# Patient Record
Sex: Female | Born: 1985 | Race: Black or African American | Hispanic: No | Marital: Single | Smoking: Current every day smoker
Health system: Southern US, Community
[De-identification: ages and names within clinical notes are randomized; demographics above are authoritative.]

## PROBLEM LIST (undated history)

## (undated) DIAGNOSIS — E282 Polycystic ovarian syndrome: Secondary | ICD-10-CM

## (undated) HISTORY — PX: LEEP: SHX91

---

## 2006-07-04 ENCOUNTER — Emergency Department (HOSPITAL_COMMUNITY): Admission: EM | Admit: 2006-07-04 | Discharge: 2006-07-04 | Payer: Self-pay | Admitting: Emergency Medicine

## 2006-07-05 ENCOUNTER — Emergency Department (HOSPITAL_COMMUNITY): Admission: EM | Admit: 2006-07-05 | Discharge: 2006-07-05 | Payer: Self-pay | Admitting: Family Medicine

## 2007-06-02 ENCOUNTER — Emergency Department (HOSPITAL_COMMUNITY): Admission: EM | Admit: 2007-06-02 | Discharge: 2007-06-02 | Payer: Self-pay | Admitting: Emergency Medicine

## 2007-06-09 ENCOUNTER — Emergency Department (HOSPITAL_COMMUNITY): Admission: EM | Admit: 2007-06-09 | Discharge: 2007-06-09 | Payer: Self-pay | Admitting: Emergency Medicine

## 2008-04-12 ENCOUNTER — Emergency Department (HOSPITAL_COMMUNITY): Admission: EM | Admit: 2008-04-12 | Discharge: 2008-04-12 | Payer: Self-pay | Admitting: Emergency Medicine

## 2009-06-10 ENCOUNTER — Emergency Department (HOSPITAL_COMMUNITY): Admission: EM | Admit: 2009-06-10 | Discharge: 2009-06-10 | Payer: Self-pay | Admitting: Pediatrics

## 2009-06-19 ENCOUNTER — Emergency Department (HOSPITAL_COMMUNITY): Admission: EM | Admit: 2009-06-19 | Discharge: 2009-06-20 | Payer: Self-pay | Admitting: Emergency Medicine

## 2009-08-05 ENCOUNTER — Emergency Department (HOSPITAL_BASED_OUTPATIENT_CLINIC_OR_DEPARTMENT_OTHER): Admission: EM | Admit: 2009-08-05 | Discharge: 2009-08-05 | Payer: Self-pay | Admitting: Emergency Medicine

## 2009-08-07 ENCOUNTER — Emergency Department (HOSPITAL_BASED_OUTPATIENT_CLINIC_OR_DEPARTMENT_OTHER): Admission: EM | Admit: 2009-08-07 | Discharge: 2009-08-07 | Payer: Self-pay | Admitting: Emergency Medicine

## 2010-10-17 ENCOUNTER — Emergency Department (HOSPITAL_BASED_OUTPATIENT_CLINIC_OR_DEPARTMENT_OTHER)
Admission: EM | Admit: 2010-10-17 | Discharge: 2010-10-17 | Payer: Self-pay | Source: Home / Self Care | Admitting: Emergency Medicine

## 2011-01-03 LAB — CULTURE, ROUTINE-ABSCESS

## 2011-02-27 ENCOUNTER — Emergency Department (HOSPITAL_COMMUNITY)
Admission: EM | Admit: 2011-02-27 | Discharge: 2011-02-27 | Disposition: A | Discharge status: Home / Self Care | Payer: Self-pay | Attending: Emergency Medicine | Admitting: Emergency Medicine

## 2011-02-27 DIAGNOSIS — R220 Localized swelling, mass and lump, head: Secondary | ICD-10-CM | POA: Insufficient documentation

## 2011-02-27 DIAGNOSIS — K047 Periapical abscess without sinus: Secondary | ICD-10-CM | POA: Insufficient documentation

## 2011-02-27 DIAGNOSIS — R221 Localized swelling, mass and lump, neck: Secondary | ICD-10-CM | POA: Insufficient documentation

## 2011-02-27 DIAGNOSIS — K0381 Cracked tooth: Secondary | ICD-10-CM | POA: Insufficient documentation

## 2011-07-13 LAB — PREGNANCY, URINE: Preg Test, Ur: NEGATIVE

## 2011-07-18 ENCOUNTER — Encounter: Payer: Self-pay | Admitting: *Deleted

## 2011-07-18 ENCOUNTER — Emergency Department (HOSPITAL_BASED_OUTPATIENT_CLINIC_OR_DEPARTMENT_OTHER)
Admission: EM | Admit: 2011-07-18 | Discharge: 2011-07-19 | Disposition: A | Discharge status: Home / Self Care | Payer: Self-pay | Attending: Emergency Medicine | Admitting: Emergency Medicine

## 2011-07-18 ENCOUNTER — Emergency Department (INDEPENDENT_AMBULATORY_CARE_PROVIDER_SITE_OTHER): Payer: Self-pay

## 2011-07-18 DIAGNOSIS — F17200 Nicotine dependence, unspecified, uncomplicated: Secondary | ICD-10-CM | POA: Insufficient documentation

## 2011-07-18 DIAGNOSIS — R0602 Shortness of breath: Secondary | ICD-10-CM | POA: Insufficient documentation

## 2011-07-18 DIAGNOSIS — J40 Bronchitis, not specified as acute or chronic: Secondary | ICD-10-CM | POA: Insufficient documentation

## 2011-07-18 MED ORDER — IPRATROPIUM BROMIDE 0.02 % IN SOLN
RESPIRATORY_TRACT | Status: AC
  Administered 2011-07-18: 0.5 mg via RESPIRATORY_TRACT
  Filled 2011-07-18: qty 2.5

## 2011-07-18 MED ORDER — ALBUTEROL SULFATE (5 MG/ML) 0.5% IN NEBU
INHALATION_SOLUTION | RESPIRATORY_TRACT | Status: AC
  Administered 2011-07-18: 5 mg via RESPIRATORY_TRACT
  Filled 2011-07-18: qty 1

## 2011-07-18 NOTE — ED Provider Notes (Signed)
History     CSN: [id number] Arrival date & time: 07/18/2011 10:40 PM   First MD Initiated Contact with Patient 07/18/11 2330      Chief Complaint  Patient presents with  . Shortness of Breath    (Consider location/radiation/quality/duration/timing/severity/associated sxs/prior treatment) HPI This is a 25 year old black female who developed a cough about 4 weeks ago. Her mother gave her an albuterol inhaler which he used for 2 weeks until cough resolved. Since that time she states she's been short of breath meaning she is having difficulty taking a deep breath. She has not used the albuterol inhaler to treat this. She states that she feels there is a sharp pain and tightness in her right lower chest that makes taking a deep breath difficult. She also complains of a pressure in her left neck when she takes a big deep breath and also when she coughs or burps. The cough has been nonproductive. It is sometimes worsened at night by position and she sometimes has trouble finding a comfortable position due to the cough. She also complains of soreness in her lower anterior chest centered about the xiphoid.  History reviewed. No pertinent past medical history.  Past Surgical History  Procedure Date  . Leep     History reviewed. No pertinent family history.  History  Substance Use Topics  . Smoking status: Current Everyday Smoker -- 0.5 packs/day  . Smokeless tobacco: Not on file  . Alcohol Use: No    OB History    Grav Para Term Preterm Abortions TAB SAB Ect Mult Living                  Review of Systems  All other systems reviewed and are negative.    Allergies  Review of patient's allergies indicates no known allergies.  Home Medications   Current Outpatient Rx  Name Route Sig Dispense Refill  . ALBUTEROL SULFATE HFA 108 (90 BASE) MCG/ACT IN AERS Inhalation Inhale 2 puffs into the lungs every 6 (six) hours as needed. For shortness of breath and wheezing     . IBUPROFEN  200 MG PO TABS Oral Take 600 mg by mouth every 6 (six) hours as needed.      Marland Kitchen NAPROXEN SODIUM 220 MG PO CAPS Oral Take 660 mg by mouth once.        BP 145/89  Pulse 81  Temp 98.1 F (36.7 C)  Resp 16  Ht 5\' 5"  (1.651 m)  Wt 135 lb (61.236 kg)  BMI 22.47 kg/m2  SpO2 100%  LMP 06/24/2011  Physical Exam General: Well-developed, well-nourished female in no acute distress; appearance consistent with age of record HENT: normocephalic, atraumatic Eyes: pupils equal round and reactive to light; extraocular muscles intact Neck: supple; slight left neck muscle tenderness Heart: regular rate and rhythm; no murmurs, rubs or gallops Lungs: clear to auscultation bilaterally; shallow respiration Chest: The xiphoid and lower rib tenderness bilaterally Abdomen: soft; nontender; nondistended Extremities: No deformity; full range of motion; pulses normal Neurologic: Awake, alert and oriented;motor function intact in all extremities and symmetric; no facial droop Skin: Warm and dry Psychiatric: Normal mood and affect    ED Course  Procedures (including critical care time)    MDM   Nursing notes and vitals signs, including pulse oximetry, reviewed.  Summary of this visit's results, reviewed by myself:  Labs:  Results for orders placed during the hospital encounter of 08/05/09  CULTURE, ROUTINE-ABSCESS      Component Value Range  Specimen Description GROIN     Special Requests NONE     Gram Stain       Value: NO WBC SEEN     NO SQUAMOUS EPITHELIAL CELLS SEEN     NO ORGANISMS SEEN   Culture       Value: MULTIPLE ORGANISMS PRESENT, NONE PREDOMINANT     Note: NO GROUP A STREP (S.PYOGENES) ISOLATED NO STAPHYLOCOCCUS AUREUS ISOLATED   Report Status 08/09/2009 FINAL      Imaging Studies: Dg Chest 2 View  07/19/2011  *RADIOLOGY REPORT*  Clinical Data: Right lower chest pain.  Shortness of breath for 2 weeks.  CHEST - 2 VIEW  Comparison: 07/04/2006.  Findings: Small nodules in the  right upper chest, stable since the prior study, likely representing calcified granulomas. The heart size and pulmonary vascularity are normal. The lungs appear clear and expanded without focal air space disease or consolidation. No blunting of the costophrenic angles.  No significant change since previous study.  IMPRESSION: No evidence of active pulmonary disease.  Original Report Authenticated By: Marlon Pel, M.D.   12:33 AM Air movement but improved, patient able to take deeper breaths after albuterol and Atrovent neb treatment. Patient advised to continue to use inhaler as needed for shortness of breath as this likely represents lingering bronchitis. Patient had no change with GI cocktail.         Hanley Seamen, MD 07/19/11 [phone]

## 2011-07-18 NOTE — ED Notes (Signed)
Dr. Molpus at bedside. 

## 2011-07-18 NOTE — ED Notes (Signed)
Pt c/o SOb and lower back pain x 2 weeks

## 2011-07-18 NOTE — ED Notes (Signed)
Pt states for approx 2 weeks, she has had SOB with pain to right rib area, left posterior neck and a sensation of 'tightness and aching" to her mid abdomen "like there is a band across my stomach". Pt denies any injury. States she had a cough approx 13month ago, but that those symptoms resolved before these started a couple weeks ago. Pt denies any new exercise routine. denies N/V/D. Denies epigastric or chest pains. States deep breathing and certain positions makes pain and difficulty breathing worsen. Pt does not appear to be in any acute distress at this time.

## 2011-07-19 DIAGNOSIS — R079 Chest pain, unspecified: Secondary | ICD-10-CM

## 2011-07-19 DIAGNOSIS — R0989 Other specified symptoms and signs involving the circulatory and respiratory systems: Secondary | ICD-10-CM

## 2011-07-19 DIAGNOSIS — R0602 Shortness of breath: Secondary | ICD-10-CM

## 2011-07-19 MED ORDER — GI COCKTAIL ~~LOC~~
30.0000 mL | Freq: Once | ORAL | Status: AC
  Administered 2011-07-19: 30 mL via ORAL
  Filled 2011-07-19: qty 30

## 2011-07-19 MED ORDER — AZITHROMYCIN 250 MG PO TABS: 500.0000 mg | ORAL_TABLET | Freq: Every day | ORAL | Status: AC

## 2011-07-19 MED ORDER — IBUPROFEN 800 MG PO TABS
800.0000 mg | ORAL_TABLET | Freq: Once | ORAL | Status: AC
  Administered 2011-07-19: 800 mg via ORAL
  Filled 2011-07-19: qty 1

## 2012-02-18 ENCOUNTER — Emergency Department (HOSPITAL_COMMUNITY)
Admission: EM | Admit: 2012-02-18 | Discharge: 2012-02-18 | Disposition: A | Discharge status: Home / Self Care | Payer: Self-pay | Attending: Emergency Medicine | Admitting: Emergency Medicine

## 2012-02-18 ENCOUNTER — Encounter (HOSPITAL_COMMUNITY): Payer: Self-pay | Admitting: *Deleted

## 2012-02-18 ENCOUNTER — Emergency Department (HOSPITAL_COMMUNITY): Payer: Self-pay

## 2012-02-18 DIAGNOSIS — R1031 Right lower quadrant pain: Secondary | ICD-10-CM | POA: Insufficient documentation

## 2012-02-18 DIAGNOSIS — A499 Bacterial infection, unspecified: Secondary | ICD-10-CM | POA: Insufficient documentation

## 2012-02-18 DIAGNOSIS — B9689 Other specified bacterial agents as the cause of diseases classified elsewhere: Secondary | ICD-10-CM | POA: Insufficient documentation

## 2012-02-18 DIAGNOSIS — N760 Acute vaginitis: Secondary | ICD-10-CM | POA: Insufficient documentation

## 2012-02-18 DIAGNOSIS — N390 Urinary tract infection, site not specified: Secondary | ICD-10-CM | POA: Insufficient documentation

## 2012-02-18 LAB — CBC
HCT: 38.3 % (ref 36.0–46.0)
Hemoglobin: 12.8 g/dL (ref 12.0–15.0)
MCH: 26.8 pg (ref 26.0–34.0)
MCHC: 33.4 g/dL (ref 30.0–36.0)
MCV: 80.3 fL (ref 78.0–100.0)

## 2012-02-18 LAB — URINALYSIS, ROUTINE W REFLEX MICROSCOPIC
Bilirubin Urine: NEGATIVE
Glucose, UA: NEGATIVE mg/dL
Ketones, ur: NEGATIVE mg/dL
Protein, ur: 30 mg/dL — AB
pH: 6 (ref 5.0–8.0)

## 2012-02-18 LAB — RPR: RPR Ser Ql: NONREACTIVE

## 2012-02-18 LAB — WET PREP, GENITAL
Trich, Wet Prep: NONE SEEN
Yeast Wet Prep HPF POC: NONE SEEN

## 2012-02-18 LAB — POCT I-STAT, CHEM 8
BUN: 8 mg/dL (ref 6–23)
Calcium, Ion: 1.21 mmol/L (ref 1.12–1.32)
Creatinine, Ser: 0.8 mg/dL (ref 0.50–1.10)
Glucose, Bld: 119 mg/dL — ABNORMAL HIGH (ref 70–99)
TCO2: 24 mmol/L (ref 0–100)

## 2012-02-18 LAB — URINE MICROSCOPIC-ADD ON

## 2012-02-18 MED ORDER — KETOROLAC TROMETHAMINE 60 MG/2ML IM SOLN
60.0000 mg | Freq: Once | INTRAMUSCULAR | Status: AC
  Administered 2012-02-18: 60 mg via INTRAMUSCULAR
  Filled 2012-02-18: qty 2

## 2012-02-18 MED ORDER — CEPHALEXIN 500 MG PO CAPS: 500.0000 mg | ORAL_CAPSULE | Freq: Four times a day (QID) | ORAL | Status: AC

## 2012-02-18 MED ORDER — IOHEXOL 300 MG/ML  SOLN
80.0000 mL | Freq: Once | INTRAMUSCULAR | Status: AC | PRN
  Administered 2012-02-18: 80 mL via INTRAVENOUS

## 2012-02-18 MED ORDER — METRONIDAZOLE 500 MG PO TABS: 500.0000 mg | ORAL_TABLET | Freq: Two times a day (BID) | ORAL | Status: AC

## 2012-02-18 MED ORDER — DEXTROSE 5 % IV SOLN
1.0000 g | Freq: Once | INTRAVENOUS | Status: AC
  Administered 2012-02-18: 1 g via INTRAVENOUS
  Filled 2012-02-18: qty 10

## 2012-02-18 MED ORDER — IOHEXOL 300 MG/ML  SOLN
20.0000 mL | INTRAMUSCULAR | Status: AC
  Administered 2012-02-18 (×2): 20 mL via ORAL

## 2012-02-18 NOTE — Discharge Instructions (Signed)
Bacterial Vaginosis Bacterial vaginosis (BV) is a vaginal infection where the normal balance of bacteria in the vagina is disrupted. The normal balance is then replaced by an overgrowth of certain bacteria. There are several different kinds of bacteria that can cause BV. BV is the most common vaginal infection in women of childbearing age. CAUSES   The cause of BV is not fully understood. BV develops when there is an increase or imbalance of harmful bacteria.   Some activities or behaviors can upset the normal balance of bacteria in the vagina and put women at increased risk including:   Having a new sex partner or multiple sex partners.   Douching.   Using an intrauterine device (IUD) for contraception.   It is not clear what role sexual activity plays in the development of BV. However, women that have never had sexual intercourse are rarely infected with BV.  Women do not get BV from toilet seats, bedding, swimming pools or from touching objects around them.  SYMPTOMS   Grey vaginal discharge.   A fish-like odor with discharge, especially after sexual intercourse.   Itching or burning of the vagina and vulva.   Burning or pain with urination.   Some women have no signs or symptoms at all.  DIAGNOSIS  Your caregiver must examine the vagina for signs of BV. Your caregiver will perform lab tests and look at the sample of vaginal fluid through a microscope. They will look for bacteria and abnormal cells (clue cells), a pH test higher than 4.5, and a positive amine test all associated with BV.  RISKS AND COMPLICATIONS   Pelvic inflammatory disease (PID).   Infections following gynecology surgery.   Developing HIV.   Developing herpes virus.  TREATMENT  Sometimes BV will clear up without treatment. However, all women with symptoms of BV should be treated to avoid complications, especially if gynecology surgery is planned. Female partners generally do not need to be treated. However,  BV may spread between female sex partners so treatment is helpful in preventing a recurrence of BV.   BV may be treated with antibiotics. The antibiotics come in either pill or vaginal cream forms. Either can be used with nonpregnant or pregnant women, but the recommended dosages differ. These antibiotics are not harmful to the baby.   BV can recur after treatment. If this happens, a second round of antibiotics will often be prescribed.   Treatment is important for pregnant women. If not treated, BV can cause a premature delivery, especially for a pregnant woman who had a premature birth in the past. All pregnant women who have symptoms of BV should be checked and treated.   For chronic reoccurrence of BV, treatment with a type of prescribed gel vaginally twice a week is helpful.  HOME CARE INSTRUCTIONS   Finish all medication as directed by your caregiver.   Do not have sex until treatment is completed.   Tell your sexual partner that you have a vaginal infection. They should see their caregiver and be treated if they have problems, such as a mild rash or itching.   Practice safe sex. Use condoms. Only have 1 sex partner.  PREVENTION  Basic prevention steps can help reduce the risk of upsetting the natural balance of bacteria in the vagina and developing BV:  Do not have sexual intercourse (be abstinent).   Do not douche.   Use all of the medicine prescribed for treatment of BV, even if the signs and symptoms go away.     Tell your sex partner if you have BV. That way, they can be treated, if needed, to prevent reoccurrence.  SEEK MEDICAL CARE IF:   Your symptoms are not improving after 3 days of treatment.   You have increased discharge, pain, or fever.  MAKE SURE YOU:   Understand these instructions.   Will watch your condition.   Will get help right away if you are not doing well or get worse.  FOR MORE INFORMATION  Division of STD Prevention (DSTDP), Centers for Disease  Control and Prevention: SolutionApps.co.za American Social Health Association (ASHA): www.ashastd.org  Document Released: 09/17/2005 Document Revised: 09/06/2011 Document Reviewed: 03/10/2009 [facility] Patient Information 2012 [place], [place].Urinary Tract Infection Infections of the urinary tract can start in several places. A bladder infection (cystitis), a kidney infection (pyelonephritis), and a prostate infection (prostatitis) are different types of urinary tract infections (UTIs). They usually get better if treated with medicines (antibiotics) that kill germs. Take all the medicine until it is gone. You or your child may feel better in a few days, but TAKE ALL MEDICINE or the infection may not respond and may become more difficult to treat. HOME CARE INSTRUCTIONS   Drink enough water and fluids to keep the urine clear or pale yellow. Cranberry juice is especially recommended, in addition to large amounts of water.   Avoid caffeine, tea, and carbonated beverages. They tend to irritate the bladder.   Alcohol may irritate the prostate.   Only take over-the-counter or prescription medicines for pain, discomfort, or fever as directed by your caregiver.  To prevent further infections:  Empty the bladder often. Avoid holding urine for long periods of time.   After a bowel movement, women should cleanse from front to back. Use each tissue only once.   Empty the bladder before and after sexual intercourse.  FINDING OUT THE RESULTS OF YOUR TEST Not all test results are available during your visit. If your or your child's test results are not back during the visit, make an appointment with your caregiver to find out the results. Do not assume everything is normal if you have not heard from your caregiver or the medical facility. It is important for you to follow up on all test results. SEEK MEDICAL CARE IF:   There is back pain.   Your baby is older than 3 months with a rectal temperature of 100.5  F (38.1 C) or higher for more than 1 day.   Your or your child's problems (symptoms) are no better in 3 days. Return sooner if you or your child is getting worse.  SEEK IMMEDIATE MEDICAL CARE IF:   There is severe back pain or lower abdominal pain.   You or your child develops chills.   You have a fever.   Your baby is older than 3 months with a rectal temperature of 102 F (38.9 C) or higher.   Your baby is 34 months old or younger with a rectal temperature of 100.4 F (38 C) or higher.   There is nausea or vomiting.   There is continued burning or discomfort with urination.  MAKE SURE YOU:   Understand these instructions.   Will watch your condition.   Will get help right away if you are not doing well or get worse.  Document Released: 06/27/2005 Document Revised: 09/06/2011 Document Reviewed: 01/30/2007 [facility] Patient Information 2012 [place], [place].  Make sure that you take the entire course of both antibiotics as prescribed.  Increase your fluid intake.  You  may use ibuprofen if needed for pain.  Get rechecked if he develops any worse symptoms, fevers, chills or nausea or vomiting.  You should have a repeat urinalysis after your antibiotic has been completed to make sure your infection is gone.  Please refer to gynecologist listed above to establish care with.  Also given resource listed below for locating a primary medical doctor.  Also as discussed there is an incidental finding on your CT scan which may be a small hemangioma on your liver, but it is recommended  that you have a repeat liver study in 6 months, preferably MRI to further evaluate this and make sure it is unchanged.  Please discuss this with your new primary doctor who can arrange this test for you.   RESOURCE GUIDE  Dental Problems  Patients with Medicaid: [facility] [phone]                                           [phone] Phone:  [phone]                                                  Phone:  [phone]  If unable to pay or uninsured, contact:  [facility] or [facility]. to become qualified for the adult dental clinic.  Chronic Pain Problems Contact [facility]  [phone] Patients need to be referred by their primary care doctor.  Insufficient Money for Medicine Contact United Way:  call "211" or [facility] 773-500-5794.  No Primary Care Doctor Call [facility]  210-589-7955 Other agencies that provide inexpensive medical care    [facility]  [phone]    [facility]  [phone]    [facility]  [phone]    [facility]  [phone]    [facility]  [phone]    [facility]  4316711286  Psychological Services [facility]  [phone] [facility] Services  [phone] [facility]   [phone] (emergency services [phone])  Substance Abuse Resources [facility]  ([phone] [facility] [phone] [facility] [phone] Floydene Flock [phone] Residential & Outpatient Substance Abuse Program  [phone]  Abuse/Neglect [facility] Child Abuse Hotline (919)133-2957 [facility] Child Abuse Hotline 607-347-2309 (After Hours)  Emergency Shelter [facility] 901-120-7677  M[facility] [place] [facility] (682)645-3645 [facility] ([phone]  MRSA Hotline #:   239-623-7626    [facility] Resources  [facility]     [employer]                          [place] [address] [place]                       [address]      [address]  [address]  Cristobal Goldmann Phone:  [phone]                                   Phone:  [phone]                  Phone:  [phone]  [facility] Phone:  [phone]  [facility] Child Abuse Hotline [phone] [phone] (After Hours)

## 2012-02-18 NOTE — ED Notes (Signed)
Oral contrast given to patient by CT.

## 2012-02-18 NOTE — ED Notes (Signed)
To ED for eval of right flank pain and rlq pain since last night. States bending and moving makes pain worse. If she lays still pain is gone. Doesn't hurt to urinate. Denies fevers. No vomiting.

## 2012-02-18 NOTE — ED Provider Notes (Signed)
Medical screening examination/treatment/procedure(s) were performed by non-physician practitioner and as supervising physician I was immediately available for consultation/collaboration.   Mannie Wineland, MD 02/18/12 1616 

## 2012-02-18 NOTE — ED Notes (Signed)
Patient is eating a [place] sandwich with some sprite.

## 2012-02-18 NOTE — ED Notes (Signed)
Pt reports right lower quadrant pain since yesterday. Denies nausea/vomiting/fever/chills. Denies vaginal discharge. States has similar pain at times during/after intercourse. Last intercourse yesterday am, but this time pain has remained. Hx of ovarian cyst with LEEP performed, states "I don't think I healed right after surgery". Surgery performed when pt was teenager.

## 2012-02-18 NOTE — ED Provider Notes (Signed)
History     CSN: [id number]  Arrival date & time 02/18/12  4098   First MD Initiated Contact with Patient 02/18/12 1100      Chief Complaint  Patient presents with  . Back Pain  . Abdominal Pain    (Consider location/radiation/quality/duration/timing/severity/associated sxs/prior treatment) HPI Comments: April Adams presents for evaluation of right lower quadrant pain which radiates into her right lower flank since yesterday evening.  She states she was simply standing doing dishes yesterday when she had sudden onset this pain.  It has been constant and sharp in character.  It is worse with movement, and she does get some improvement when she lies  in the right decubital position.  She denies nausea vomiting fevers or chills, has had no vaginal discharge and no urinary symptoms.  Her last bowel movement was yesterday and normal.  She reports occasional similar fleeting symptoms with intercourse.  Her last intercourse was yesterday morning she was pain free at that time.  Past history is significant for a history of ovarian cyst over 4 years ago.  She has also had a LEEP procedure for atypical cervical cells.  She has no personal or family history of kidney stones.  Patient is a 26 y.o. female presenting with back pain and abdominal pain. The history is provided by the patient.  Back Pain  Associated symptoms include abdominal pain. Pertinent negatives include no chest pain, no fever, no numbness, no headaches, no dysuria and no weakness.  Abdominal Pain The primary symptoms of the illness include abdominal pain. The primary symptoms of the illness do not include fever, shortness of breath, nausea, vomiting, diarrhea, dysuria or vaginal discharge.  Additional symptoms associated with the illness include back pain. Symptoms associated with the illness do not include constipation.    History reviewed. No pertinent past medical history.  Past Surgical History  Procedure Date  . Leep       History reviewed. No pertinent family history.  History  Substance Use Topics  . Smoking status: Current Everyday Smoker -- 0.5 packs/day  . Smokeless tobacco: Not on file  . Alcohol Use: No    OB History    Grav Para Term Preterm Abortions TAB SAB Ect Mult Living                  Review of Systems  Constitutional: Negative for fever.  HENT: Negative for congestion, sore throat and neck pain.   Eyes: Negative.   Respiratory: Negative for chest tightness and shortness of breath.   Cardiovascular: Negative for chest pain.  Gastrointestinal: Positive for abdominal pain. Negative for nausea, vomiting, diarrhea and constipation.  Genitourinary: Positive for flank pain. Negative for dysuria and vaginal discharge.  Musculoskeletal: Positive for back pain. Negative for joint swelling and arthralgias.  Skin: Negative.  Negative for rash and wound.  Neurological: Negative for dizziness, weakness, light-headedness, numbness and headaches.  Hematological: Negative.   Psychiatric/Behavioral: Negative.     Allergies  Review of patient's allergies indicates no known allergies.  Home Medications   Current Outpatient Rx  Name Route Sig Dispense Refill  . ACETAMINOPHEN 500 MG PO TABS Oral Take 1,500 mg by mouth every 6 (six) hours as needed. For pain    . ASPIRIN-ACETAMINOPHEN-CAFFEINE 250-250-65 MG PO TABS Oral Take 1 tablet by mouth every 6 (six) hours as needed. For headache    . CEPHALEXIN 500 MG PO CAPS Oral Take 1 capsule (500 mg total) by mouth 4 (four) times daily. 40 capsule  0  . METRONIDAZOLE 500 MG PO TABS Oral Take 1 tablet (500 mg total) by mouth 2 (two) times daily. 14 tablet 0    BP 96/61  Pulse 63  Temp(Src) 98.6 F (37 C) (Oral)  Resp 16  SpO2 99%  LMP 02/06/2012  Physical Exam  Nursing note and vitals reviewed. Constitutional: She appears well-developed and well-nourished.  HENT:  Head: Normocephalic and atraumatic.  Eyes: Conjunctivae are normal.  Neck:  Normal range of motion.  Cardiovascular: Normal rate, regular rhythm, normal heart sounds and intact distal pulses.   Pulmonary/Chest: Effort normal and breath sounds normal. She has no wheezes.  Abdominal: Soft. Bowel sounds are normal. There is no tenderness.  Genitourinary: Vagina normal and uterus normal. Uterus is not enlarged and not tender. Cervix exhibits no motion tenderness, no discharge and no friability. Right adnexum displays no mass, no tenderness and no fullness. Left adnexum displays no mass, no tenderness and no fullness. No tenderness around the vagina.  Musculoskeletal: Normal range of motion.  Neurological: She is alert.  Skin: Skin is warm and dry.  Psychiatric: She has a normal mood and affect.    ED Course  Procedures (including critical care time)  Labs Reviewed  URINALYSIS, ROUTINE W REFLEX MICROSCOPIC - Abnormal; Notable for the following:    APPearance TURBID (*)    Hgb urine dipstick LARGE (*)    Protein, ur 30 (*)    Nitrite POSITIVE (*)    Leukocytes, UA LARGE (*)    All other components within normal limits  CBC - Abnormal; Notable for the following:    WBC 17.4 (*)    All other components within normal limits  POCT I-STAT, CHEM 8 - Abnormal; Notable for the following:    Glucose, Bld 119 (*)    All other components within normal limits  URINE MICROSCOPIC-ADD ON - Abnormal; Notable for the following:    Squamous Epithelial / LPF FEW (*)    Bacteria, UA FEW (*)    All other components within normal limits  WET PREP, GENITAL - Abnormal; Notable for the following:    Clue Cells Wet Prep HPF POC MODERATE (*)    WBC, Wet Prep HPF POC MODERATE (*)    All other components within normal limits  POCT PREGNANCY, URINE  GC/CHLAMYDIA PROBE AMP, GENITAL  RPR  URINE CULTURE   Ct Abdomen Pelvis W Contrast  02/18/2012  *RADIOLOGY REPORT*  Clinical Data: Right lower quadrant pain  CT ABDOMEN AND PELVIS WITH CONTRAST  Technique:  Multidetector CT imaging of the  abdomen and pelvis was performed following the standard protocol during bolus administration of intravenous contrast.  Contrast: 80mL OMNIPAQUE IOHEXOL 300 MG/ML  SOLN  Comparison: None.  Findings: Lung bases are clear.  1.5 x 1.5 cm hypoenhancing lesion in the inferior right hepatic lobe (series 2/image 29).  Spleen, pancreas, and adrenal glands are within normal limits.  Gallbladder is unremarkable.  No intrahepatic or extrahepatic ductal dilatation.  Kidneys are within normal limits.  No hydronephrosis.  No evidence of bowel obstruction.  Normal appendix. No evidence of abdominal aortic aneurysm.  Suspicious abdominopelvic lymphadenopathy.  Uterus and bilateral ovaries are grossly unremarkable.  Trace pelvic ascites.  Very mild inflammatory changes in the right pelvic mesentery (series 2/image 61), correlate for PID.  Bladder is within normal limits.  Visualized osseous structures are within normal limits.  IMPRESSION: Normal appendix.  No evidence of bowel obstruction.  Very mild inflammatory changes in the right pelvic mesentery, correlate for  PID.  1.5 x 1.5 cm hypoenhancing lesion in the inferior right hepatic lobe, statistically likely reflecting a benign lesion such as a hemangioma, although indeterminate.  Consider follow-up MRI with/without contrast in 6 months for definitive characterization as clinically warranted.  This recommendation follows ACR consensus guidelines:  Managing Incidental Findings on Abdominal CT:  White Paper of the ACR Incidental Findings Committee.  J Am Coll Radiol 2010;7:754-773  Original Report Authenticated By: Charline Bills, M.D.     1. UTI (lower urinary tract infection)   2. Bacterial vaginosis     Patient was given a dose of Toradol 60 mg IM which relieved her symptoms.  MDM  Discuss results of labs and CT scan with patient.  Notified her that gonorrhea and Chlamydia cultures are pending.  Given she had a nontender bimanual exam, doubt PID or cervicitis.   Discussed treating prophylactically for these 2 possible infections, and she and mom chose to defer pending cultures.  She will be treated for UTI with Keflex and was also prescribed metronidazole for her bacterial vaginosis.  She was given an IV dose of Rocephin 1 g prior to discharge home.  Referral to Dr. Claiborne Billings for GYN care, also given resource guide for finding primary medical care.  Also discussed finding a possible small hemangioma in her liver which will require imaging study such as MRI in 6 months to follow.  Patient to obtain primary medical care for this.        Burgess Amor, [place] 02/18/12 1555

## 2012-02-19 LAB — GC/CHLAMYDIA PROBE AMP, GENITAL
Chlamydia, DNA Probe: NEGATIVE
GC Probe Amp, Genital: NEGATIVE

## 2012-02-20 LAB — URINE CULTURE: Colony Count: >=100000

## 2012-02-21 NOTE — ED Notes (Signed)
+   urine Patient treated with rocephin and keflex-sensitive to same-chart appended per protocol MD.

## 2019-08-11 ENCOUNTER — Emergency Department (HOSPITAL_BASED_OUTPATIENT_CLINIC_OR_DEPARTMENT_OTHER)
Admission: EM | Admit: 2019-08-11 | Discharge: 2019-08-11 | Disposition: A | Discharge status: Home / Self Care | Payer: BC Managed Care – PPO | Attending: Emergency Medicine | Admitting: Emergency Medicine

## 2019-08-11 ENCOUNTER — Encounter (HOSPITAL_BASED_OUTPATIENT_CLINIC_OR_DEPARTMENT_OTHER): Payer: Self-pay | Admitting: *Deleted

## 2019-08-11 ENCOUNTER — Other Ambulatory Visit: Payer: Self-pay

## 2019-08-11 DIAGNOSIS — F17210 Nicotine dependence, cigarettes, uncomplicated: Secondary | ICD-10-CM | POA: Insufficient documentation

## 2019-08-11 DIAGNOSIS — F1210 Cannabis abuse, uncomplicated: Secondary | ICD-10-CM | POA: Insufficient documentation

## 2019-08-11 DIAGNOSIS — J029 Acute pharyngitis, unspecified: Secondary | ICD-10-CM

## 2019-08-11 DIAGNOSIS — R070 Pain in throat: Secondary | ICD-10-CM | POA: Diagnosis not present

## 2019-08-11 DIAGNOSIS — Z20828 Contact with and (suspected) exposure to other viral communicable diseases: Secondary | ICD-10-CM | POA: Diagnosis not present

## 2019-08-11 LAB — GROUP A STREP BY PCR: Group A Strep by PCR: NOT DETECTED

## 2019-08-11 MED ORDER — DEXAMETHASONE 6 MG PO TABS
10.0000 mg | ORAL_TABLET | Freq: Once | ORAL | Status: AC
  Administered 2019-08-11: 19:00:00 10 mg via ORAL
  Filled 2019-08-11: qty 1

## 2019-08-11 NOTE — ED Provider Notes (Signed)
April Adams Provider Note MRN:  [id number]  Arrival date & time: 08/11/19     Chief Complaint   Sore Throat   History of Present Illness   April Adams is a 33 y.o. year-old female with no pertinent past medical history presenting to the ED with chief complaint of sore throat.  2 days, gradual onset, worsening.  Denies cough, no fever.  Otherwise no complaints.  Noticed some white stuff on her tonsils.  Review of Systems  A problem-focused ROS was performed. Positive for sore throat.  Patient denies fever.  Patient's Health History   History reviewed. No pertinent past medical history.  Past Surgical History:  Procedure Laterality Date  . LEEP      No family history on file.  Social History   Socioeconomic History  . Marital status: Single    Spouse name: Not on file  . Number of children: Not on file  . Years of education: Not on file  . Highest education level: Not on file  Occupational History  . Not on file  Social Needs  . Financial resource strain: Not on file  . Food insecurity    Worry: Not on file    Inability: Not on file  . Transportation needs    Medical: Not on file    Non-medical: Not on file  Tobacco Use  . Smoking status: Current Every Day Smoker    Packs/day: 0.50  . Smokeless tobacco: Never Used  Substance and Sexual Activity  . Alcohol use: No  . Drug use: Yes    Types: Marijuana  . Sexual activity: Yes  Lifestyle  . Physical activity    Days per week: Not on file    Minutes per session: Not on file  . Stress: Not on file  Relationships  . Social Herbalist on phone: Not on file    Gets together: Not on file    Attends religious service: Not on file    Active member of club or organization: Not on file    Attends meetings of clubs or organizations: Not on file    Relationship status: Not on file  . Intimate partner violence    Fear of current or ex partner: Not on  file    Emotionally abused: Not on file    Physically abused: Not on file    Forced sexual activity: Not on file  Other Topics Concern  . Not on file  Social History Narrative  . Not on file     Physical Exam  Vital Signs and Nursing Notes reviewed Vitals:   08/11/19 1830  BP: 140/84  Pulse: 95  Resp: 14  Temp: 98.8 F (37.1 C)  SpO2: 97%    CONSTITUTIONAL: Well-appearing, NAD NEURO:  Alert and oriented x 3, no focal deficits EYES:  eyes equal and reactive ENT/NECK:  no LAD, no JVD; bilateral edematous tonsils with exudate CARDIO: Regular rate, well-perfused, normal S1 and S2 PULM:  CTAB no wheezing or rhonchi GI/GU:  normal bowel sounds, non-distended, non-tender MSK/SPINE:  No gross deformities, no edema SKIN:  no rash, atraumatic PSYCH:  Appropriate speech and behavior  Diagnostic and Interventional Summary    EKG Interpretation  Date/Time:    Ventricular Rate:    PR Interval:    QRS Duration:   QT Interval:    QTC Calculation:   R Axis:     Text Interpretation:        Labs  Reviewed  GROUP A STREP BY PCR  SARS CORONAVIRUS 2 (TAT 6-24 HRS)    No orders to display    Medications  dexamethasone (DECADRON) tablet 10 mg (10 mg Oral Given 08/11/19 1912)     Procedures  /  Critical Care Procedures  ED Course and Medical Decision Making  I have reviewed the triage vital signs and the nursing notes.  Pertinent labs & imaging results that were available during my care of the patient were reviewed by me and considered in my medical decision making (see below for details).     Considering strep throat, less likely mono given lack of other symptoms, also considering coronavirus.  Strep is negative, coronavirus sent, appropriate for discharge.  April [facility] was evaluated in [facility] on 08/11/2019 for the symptoms described in the history of present illness. She was evaluated in the context of the global COVID-19 pandemic, which necessitated  consideration that the patient might be at risk for infection with the SARS-CoV-2 virus that causes COVID-19. Institutional protocols and algorithms that pertain to the evaluation of patients at risk for COVID-19 are in a state of rapid change based on information released by regulatory bodies including the CDC and federal and state organizations. These policies and algorithms were followed during the patient's care in the ED.   Elmer Sow. Pilar Plate, MD [facility] Emergency [facility] [email or web address]  Final Clinical Impressions(s) / ED Diagnoses     ICD-10-CM   1. Pharyngitis, unspecified etiology  J02.9     ED Discharge Orders    None       Discharge Instructions Discussed with and Provided to Patient:     Discharge Instructions     You were evaluated in the [facility] and after careful evaluation, we did not find any emergent condition requiring admission or further testing in the hospital.  Your exam/testing today is overall reassuring.  You tested negative for strep throat.  We have tested you for the coronavirus here in the [facility].  Please isolate or quarantine at home until you receive a negative test result.  If positive, we recommend continued home quarantine per [facility] recommendations.   Please return to the [facility] if you experience any worsening of your condition.  We encourage you to follow up with a primary care provider.  Thank you for allowing [place] to be a part of your care.      Sabas Sous, MD 08/11/19 2006

## 2019-08-11 NOTE — Discharge Instructions (Addendum)
You were evaluated in the Emergency Department and after careful evaluation, we did not find any emergent condition requiring admission or further testing in the hospital.  Your exam/testing today is overall reassuring.  You tested negative for strep throat.  We have tested you for the coronavirus here in the Emergency Department.  Please isolate or quarantine at home until you receive a negative test result.  If positive, we recommend continued home quarantine per [facility] recommendations.   Please return to the Emergency Department if you experience any worsening of your condition.  We encourage you to follow up with a primary care provider.  Thank you for allowing [place] to be a part of your care.

## 2019-08-12 LAB — SARS CORONAVIRUS 2 (TAT 6-24 HRS): SARS Coronavirus 2: NEGATIVE

## 2019-08-13 ENCOUNTER — Ambulatory Visit (HOSPITAL_COMMUNITY)
Admission: EM | Admit: 2019-08-13 | Discharge: 2019-08-13 | Disposition: A | Discharge status: Home / Self Care | Payer: BC Managed Care – PPO

## 2019-08-13 ENCOUNTER — Encounter (HOSPITAL_COMMUNITY): Payer: Self-pay

## 2019-08-13 ENCOUNTER — Other Ambulatory Visit: Payer: Self-pay

## 2019-08-13 DIAGNOSIS — Z720 Tobacco use: Secondary | ICD-10-CM

## 2019-08-13 DIAGNOSIS — J039 Acute tonsillitis, unspecified: Secondary | ICD-10-CM

## 2019-08-13 DIAGNOSIS — R0982 Postnasal drip: Secondary | ICD-10-CM

## 2019-08-13 MED ORDER — PENICILLIN G BENZATHINE 1200000 UNIT/2ML IM SUSP
INTRAMUSCULAR | Status: AC
  Filled 2019-08-13: qty 2

## 2019-08-13 MED ORDER — PENICILLIN G BENZATHINE 1200000 UNIT/2ML IM SUSP
1.2000 10*6.[IU] | Freq: Once | INTRAMUSCULAR | Status: AC
  Administered 2019-08-13: 19:00:00 1.2 10*6.[IU] via INTRAMUSCULAR

## 2019-08-13 NOTE — ED Provider Notes (Signed)
[place]   MRN: [id number] DOB: Dec 06, 1985  Subjective:   April Adams is a 33 y.o. female presenting for 4 day hx of persistent throat pain. Smokes ~3-4 cigarettes per day. Has allergies in the morning. Does not take anything for this.  Of note, patient has had testing for COVID-19 and the rapid strep test both of which were negative on 08/11/2019.  No current facility-administered medications for this encounter.   Current Outpatient Medications:  .  Throat Lozenges (COUGH DROPS MT), Use as directed in the mouth or throat., Disp: , Rfl:  .  acetaminophen (TYLENOL) 500 MG tablet, Take 1,500 mg by mouth every 6 (six) hours as needed. For pain, Disp: , Rfl:  .  aspirin-acetaminophen-caffeine (EXCEDRIN MIGRAINE) 250-250-65 MG per tablet, Take 1 tablet by mouth every 6 (six) hours as needed. For headache, Disp: , Rfl:    No Known Allergies  History reviewed. No pertinent past medical history.   Past Surgical History:  Procedure Laterality Date  . LEEP      History reviewed. No pertinent family history.  Social History   Tobacco Use  . Smoking status: Current Every Day Smoker    Packs/day: 0.50  . Smokeless tobacco: Never Used  Substance Use Topics  . Alcohol use: No  . Drug use: Yes    Types: Marijuana    ROS   Objective:   Vitals: BP 117/72 (BP Location: Left Arm)   Pulse 79   Temp 97.6 F (36.4 C) (Temporal)   Resp 15   LMP 07/27/2019   SpO2 98%   Physical Exam Constitutional:      General: She is not in acute distress.    Appearance: Normal appearance. She is well-developed. She is not ill-appearing.  HENT:     Head: Normocephalic and atraumatic.     Right Ear: Tympanic membrane and ear canal normal. No drainage or tenderness. No middle ear effusion. Tympanic membrane is not erythematous.     Left Ear: Tympanic membrane and ear canal normal. No drainage or tenderness.  No middle ear effusion. Tympanic membrane is not erythematous.   Nose: Nose normal. No congestion or rhinorrhea.     Mouth/Throat:     Mouth: Mucous membranes are moist. No oral lesions.     Pharynx: Pharyngeal swelling and oropharyngeal exudate present. No posterior oropharyngeal erythema or uvula swelling.     Tonsils: Tonsillar exudate (left-sided with 1+ tonsillar edema) present. No tonsillar abscesses.  Eyes:     General: No scleral icterus.    Extraocular Movements: Extraocular movements intact.     Right eye: Normal extraocular motion.     Left eye: Normal extraocular motion.     Conjunctiva/sclera: Conjunctivae normal.     Pupils: Pupils are equal, round, and reactive to light.  Neck:     Musculoskeletal: Normal range of motion and neck supple.  Cardiovascular:     Rate and Rhythm: Normal rate.  Pulmonary:     Effort: Pulmonary effort is normal.  Lymphadenopathy:     Cervical: No cervical adenopathy.  Skin:    General: Skin is warm and dry.  Neurological:     General: No focal deficit present.     Mental Status: She is alert and oriented to person, place, and time.  Psychiatric:        Mood and Affect: Mood normal.        Behavior: Behavior normal.     Assessment and Plan :   1. Tonsillitis  2. Post-nasal drainage   3. Tobacco use     We will treat empirically for tonsillitis/pharyngitis with penicillin injection in clinic.  However, also emphasized need to address allergic rhinitis, postnasal drainage with daily antihistamine and pseudoephedrine as needed.  Highly encourage patient to quit smoking. Counseled patient on potential for adverse effects with medications prescribed/recommended today, ER and return-to-clinic precautions discussed, patient verbalized understanding.    Wallis Bamberg, PA-C 08/13/19 1918

## 2019-08-13 NOTE — ED Triage Notes (Signed)
Pt presents to the UC with sore throat. Pt reports she was seen at the ED 2 days ago, x sore throat in the right side of her throat. Pt report she had a negative Strep test and negative Covid test. Pt states she started having left sided sore throat.

## 2019-08-13 NOTE — Discharge Instructions (Addendum)
I am addressing your concern for infection and will cover for strep with a penicillin injection due to your physical exam. However, I need you to address post-nasal drainage and allergic rhinitis as a source of your symptoms as well with an antihistamine like Zyrtec, Allegra or Claritin. Take one of these every day. Use Sudafed (pseudoephedrine) as needed for post-nasal drainage or congestion. Take 60mg  three times a day or 120mg  twice a day as needed. Please make every effort to quit smoking as well.

## 2020-01-06 DIAGNOSIS — Z111 Encounter for screening for respiratory tuberculosis: Secondary | ICD-10-CM | POA: Diagnosis not present

## 2020-01-25 DIAGNOSIS — Z01419 Encounter for gynecological examination (general) (routine) without abnormal findings: Secondary | ICD-10-CM | POA: Diagnosis not present

## 2020-01-25 DIAGNOSIS — Z1151 Encounter for screening for human papillomavirus (HPV): Secondary | ICD-10-CM | POA: Diagnosis not present

## 2020-01-25 DIAGNOSIS — Z3009 Encounter for other general counseling and advice on contraception: Secondary | ICD-10-CM | POA: Diagnosis not present

## 2020-01-25 DIAGNOSIS — N921 Excessive and frequent menstruation with irregular cycle: Secondary | ICD-10-CM | POA: Diagnosis not present

## 2020-01-25 DIAGNOSIS — Z6831 Body mass index (BMI) 31.0-31.9, adult: Secondary | ICD-10-CM | POA: Diagnosis not present

## 2020-01-28 DIAGNOSIS — Z0000 Encounter for general adult medical examination without abnormal findings: Secondary | ICD-10-CM | POA: Diagnosis not present

## 2020-01-28 DIAGNOSIS — M179 Osteoarthritis of knee, unspecified: Secondary | ICD-10-CM | POA: Diagnosis not present

## 2020-04-13 DIAGNOSIS — N920 Excessive and frequent menstruation with regular cycle: Secondary | ICD-10-CM | POA: Diagnosis not present

## 2020-04-13 DIAGNOSIS — E282 Polycystic ovarian syndrome: Secondary | ICD-10-CM | POA: Diagnosis not present

## 2020-04-19 ENCOUNTER — Encounter (HOSPITAL_COMMUNITY): Payer: Self-pay

## 2020-04-19 ENCOUNTER — Other Ambulatory Visit: Payer: Self-pay

## 2020-04-19 ENCOUNTER — Ambulatory Visit (HOSPITAL_COMMUNITY)
Admission: EM | Admit: 2020-04-19 | Discharge: 2020-04-19 | Disposition: A | Discharge status: Home / Self Care | Payer: BC Managed Care – PPO | Attending: Physician Assistant | Admitting: Physician Assistant

## 2020-04-19 DIAGNOSIS — U071 COVID-19: Secondary | ICD-10-CM | POA: Diagnosis not present

## 2020-04-19 DIAGNOSIS — J029 Acute pharyngitis, unspecified: Secondary | ICD-10-CM | POA: Diagnosis not present

## 2020-04-19 DIAGNOSIS — Z20822 Contact with and (suspected) exposure to covid-19: Secondary | ICD-10-CM

## 2020-04-19 HISTORY — DX: Polycystic ovarian syndrome: E28.2

## 2020-04-19 MED ORDER — FLUTICASONE PROPIONATE 50 MCG/ACT NA SUSP: 1.0000 | Freq: Every day | NASAL | 0 refills | Status: AC

## 2020-04-19 MED ORDER — CEPACOL SORE THROAT 5.4 MG MT LOZG: 1.0000 | LOZENGE | OROMUCOSAL | 0 refills | Status: AC | PRN

## 2020-04-19 NOTE — ED Provider Notes (Signed)
[facility]    CSN: [id number] Arrival date & time: 04/19/20  1756      History   Chief Complaint Chief Complaint  Patient presents with  . COVID exposure    HPI April Adams is a 34 y.o. female.   Patient reports urgent care for Covid testing after recent Covid exposure and onset of sore throat today.  She reports she was at a large gathering for a funeral with numerous people began experiencing cold-like symptoms.  In individual has since tested positive for Covid.  She reports symptoms started today with only a sore throat.  She describes this as a scratchy sore throat is not severe.  Denies fever, chills, cough, shortness of breath, nausea, vomiting, diarrhea.  She reports she does not feel poorly.     Past Medical History:  Diagnosis Date  . Polycystic ovaries     There are no problems to display for this patient.   Past Surgical History:  Procedure Laterality Date  . LEEP      OB History   No obstetric history on file.      Home Medications    Prior to Admission medications   Medication Sig Start Date End Date Taking? Authorizing Provider  Multiple Vitamin (MULTIVITAMIN) tablet Take 1 tablet by mouth daily.   Yes [provider]  Prenatal Vit-Fe Fumarate-FA (MULTIVITAMIN-PRENATAL) 27-0.8 MG TABS tablet Take 1 tablet by mouth daily at 12 noon.   Yes [provider]  fluticasone (FLONASE) 50 MCG/ACT nasal spray Place 1 spray into both nostrils daily. 04/19/20   Darelle Kings, Veryl Speak, PA-C  Menthol (CEPACOL SORE THROAT) 5.4 MG LOZG Use as directed 1 lozenge (5.4 mg total) in the mouth or throat every 2 (two) hours as needed. 04/19/20   Shalee Paolo, Veryl Speak, PA-C  Throat Lozenges (COUGH DROPS MT) Use as directed in the mouth or throat.    [provider]    Family History No family history on file.  Social History Social History   Tobacco Use  . Smoking status: Current Every Day Smoker    Packs/day: 0.50  . Smokeless tobacco:  Never Used  Substance Use Topics  . Alcohol use: No  . Drug use: Not Currently    Types: Marijuana     Allergies   Patient has no known allergies.   Review of Systems Review of Systems   Physical Exam Triage Vital Signs ED Triage Vitals  Enc Vitals Group     BP 04/19/20 1852 137/87     Pulse Rate 04/19/20 1852 86     Resp 04/19/20 1852 18     Temp 04/19/20 1852 98.6 F (37 C)     Temp Source 04/19/20 1852 Oral     SpO2 04/19/20 1852 100 %     Weight 04/19/20 1853 190 lb (86.2 kg)     Height 04/19/20 1853 5\' 5"  (1.651 m)     Head Circumference --      Peak Flow --      Pain Score 04/19/20 1852 4     Pain Loc --      Pain Edu? --      Excl. in GC? --    No data found.  Updated Vital Signs BP 137/87   Pulse 86   Temp 98.6 F (37 C) (Oral)   Resp 18   Ht 5\' 5"  (1.651 m)   Wt 190 lb (86.2 kg)   SpO2 100%   BMI 31.62 kg/m  Visual Acuity Right Eye Distance:   Left Eye Distance:   Bilateral Distance:    Right Eye Near:   Left Eye Near:    Bilateral Near:     Physical Exam Vitals and nursing note reviewed.  Constitutional:      General: She is not in acute distress.    Appearance: She is well-developed. She is not ill-appearing.  HENT:     Head: Normocephalic and atraumatic.     Mouth/Throat:     Mouth: Mucous membranes are moist.     Pharynx: Oropharynx is clear. No oropharyngeal exudate or posterior oropharyngeal erythema.  Eyes:     Conjunctiva/sclera: Conjunctivae normal.  Cardiovascular:     Rate and Rhythm: Normal rate and regular rhythm.     Heart sounds: No murmur heard.   Pulmonary:     Effort: Pulmonary effort is normal. No respiratory distress.     Breath sounds: Normal breath sounds.  Abdominal:     Palpations: Abdomen is soft.     Tenderness: There is no abdominal tenderness.  Musculoskeletal:     Cervical back: Neck supple.  Lymphadenopathy:     Cervical: No cervical adenopathy.  Skin:    General: Skin is warm and dry.   Neurological:     Mental Status: She is alert.      UC Treatments / Results  Labs (all labs ordered are listed, but only abnormal results are displayed) Labs Reviewed  SARS CORONAVIRUS 2 (TAT 6-24 HRS)    EKG   Radiology No results found.  Procedures Procedures (including critical care time)  Medications Ordered in UC Medications - No data to display  Initial Impression / Assessment and Plan / UC Course  I have reviewed the triage vital signs and the nursing notes.  Pertinent labs & imaging results that were available during my care of the patient were reviewed by me and considered in my medical decision making (see chart for details).     #Pharyngitis Patient is a 34 year old presenting with recent Covid exposure now onset of pharyngitis.  Low suspicion for streptococcal infection will defer testing given known Covid exposure and numerous people with similar symptoms.  Covid PCR sent.  Symptomatic care.  Discussed monitoring of symptoms with patient.  Return and follow-up precautions were discussed.  She verbalized understanding plan of care. Final Clinical Impressions(s) / UC Diagnoses   Final diagnoses:  Acute pharyngitis, unspecified etiology  Close exposure to COVID-19 virus  Encounter for laboratory testing for COVID-19 virus     Discharge Instructions     Use the lozenges Flonase daily  Over the counter cough medicines if cough develops Tylenol or ibuprofen at regular doses  Monitor symptoms, if severe, shortness of breath, high fever, unable to swallow go to the ED  If your Covid-19 test is positive, you will receive a phone call from [facility] regarding your results. Negative test results are not called. Both positive and negative results area always visible on MyChart. If you do not have a MyChart account, sign up instructions are in your discharge papers.   Persons who are directed to care for themselves at home may discontinue isolation under  the following conditions:  . At least 10 days have passed since symptom onset and . At least 24 hours have passed without running a fever (this means without the use of fever-reducing medications) and . Other symptoms have improved.  Persons infected with COVID-19 who never develop symptoms may discontinue isolation and other precautions 10  days after the date of their first positive COVID-19 test.     ED Prescriptions    Medication Sig Dispense Auth. Provider   fluticasone (FLONASE) 50 MCG/ACT nasal spray Place 1 spray into both nostrils daily. 15.8 mL Sam Overbeck, Veryl Speak, PA-C   Menthol (CEPACOL SORE THROAT) 5.4 MG LOZG Use as directed 1 lozenge (5.4 mg total) in the mouth or throat every 2 (two) hours as needed. 30 lozenge Shaguana Love, Veryl Speak, PA-C     PDMP not reviewed this encounter.   Hermelinda Medicus, PA-C 04/19/20 2359

## 2020-04-19 NOTE — ED Triage Notes (Signed)
Pt was around a COVID + person and pt has sore throat that started today.

## 2020-04-19 NOTE — Discharge Instructions (Signed)
Use the lozenges Flonase daily  Over the counter cough medicines if cough develops Tylenol or ibuprofen at regular doses  Monitor symptoms, if severe, shortness of breath, high fever, unable to swallow go to the ED  If your Covid-19 test is positive, you will receive a phone call from [facility] regarding your results. Negative test results are not called. Both positive and negative results area always visible on MyChart. If you do not have a MyChart account, sign up instructions are in your discharge papers.   Persons who are directed to care for themselves at home may discontinue isolation under the following conditions:   At least 10 days have passed since symptom onset and  At least 24 hours have passed without running a fever (this means without the use of fever-reducing medications) and  Other symptoms have improved.  Persons infected with COVID-19 who never develop symptoms may discontinue isolation and other precautions 10 days after the date of their first positive COVID-19 test.

## 2020-04-20 ENCOUNTER — Telehealth: Payer: Self-pay | Admitting: Nurse Practitioner

## 2020-04-20 LAB — SARS CORONAVIRUS 2 (TAT 6-24 HRS): SARS Coronavirus 2: POSITIVE — AB

## 2020-04-20 NOTE — Telephone Encounter (Signed)
Called to discuss with Paticia Stack about Covid symptoms and the use of casirivimab/imdevimab, a combination monoclonal antibody infusion for those with mild to moderate Covid symptoms and at a high risk of hospitalization.     Pt is qualified for this infusion at the [facility] infusion center due to co-morbid conditions (BMI >25). Per urgent care notations patient's symptoms started on 04/19/20 with sore throat.   Unable to reach patient. Voicemail left with contact information and MyChart message sent.   Willette Alma, AGPCNP-BC

## 2020-04-21 ENCOUNTER — Telehealth: Payer: Self-pay | Admitting: Nurse Practitioner

## 2020-04-21 NOTE — Telephone Encounter (Signed)
Called to discuss with Paticia Stack about Covid symptoms and the use of casirivimab/imdevimab, a combination monoclonal antibody infusion for those with mild to moderate Covid symptoms and at a high risk of hospitalization.     Pt is qualified for this infusion at the [facility] infusion center due to co-morbid conditions and/or a member of an at-risk group (BMI >25).   Unable to reach. Voicemail left. Mychart message was sent on 04/20/20 which was read by patient.   Willette Alma, AGPCNP-BC

## 2020-04-23 ENCOUNTER — Ambulatory Visit (HOSPITAL_COMMUNITY)
Admission: EM | Admit: 2020-04-23 | Discharge: 2020-04-23 | Disposition: A | Discharge status: Home / Self Care | Payer: BC Managed Care – PPO | Attending: Emergency Medicine | Admitting: Emergency Medicine

## 2020-04-23 ENCOUNTER — Encounter (HOSPITAL_COMMUNITY): Payer: Self-pay

## 2020-04-23 DIAGNOSIS — N760 Acute vaginitis: Secondary | ICD-10-CM

## 2020-04-23 MED ORDER — FLUCONAZOLE 150 MG PO TABS: ORAL_TABLET | ORAL | 0 refills | Status: DC

## 2020-04-23 NOTE — Discharge Instructions (Signed)
We will start yeast treatment with your vaginal testing pending.  We will notify of you any positive findings or if any changes to treatment are needed. If normal or otherwise without concern to your results, we will not call you. Please log on to your MyChart to review your results if interested in so.   Please follow up with your primary care provider as elevated blood sugars can contribute to yeast infections.  May use over the counter  monistat to help with itching, cool compresses.

## 2020-04-23 NOTE — ED Triage Notes (Signed)
Pt presents to UC for vaginal itching and dryness. Pt states symptoms have been presents since covid diagnosis on Tuesday. Pt has been treating with vaseline with out relief. Pt also endorsing weakness r/t covid.

## 2020-04-24 NOTE — ED Provider Notes (Signed)
[facility]    CSN: [id number] Arrival date & time: 04/23/20  1211      History   Chief Complaint Chief Complaint  Patient presents with  . Vaginal Itching  . covid positive    HPI April Adams is a 34 y.o. female.   April Adams presents with complaints of vaginal dryness and itching which started 7/20. She was diagnosed with covid at the same time of onset of symptoms. She has noted thick white vaginal discharge as well. No vaginal odor. History of yeast infections which have felt similar. She feels external vulvar irritation as well but no sores or lesions. No vaginal bleeding. LMP 7/4. She has been prescribed medications for pre diabetes but she has not started these. Denies any urinary symptoms. Sexually active  With 1 partner, doesn't use condoms. Denies concern for any obvious std's at this time.    ROS per HPI, negative if not otherwise mentioned.      Past Medical History:  Diagnosis Date  . Polycystic ovaries     There are no problems to display for this patient.   Past Surgical History:  Procedure Laterality Date  . LEEP      OB History   No obstetric history on file.      Home Medications    Prior to Admission medications   Medication Sig Start Date End Date Taking? Authorizing Provider  fluconazole (DIFLUCAN) 150 MG tablet Take 1 tablet today. If still with symptoms may repeat in 3 days. 04/23/20   Georgetta Haber, NP  fluticasone (FLONASE) 50 MCG/ACT nasal spray Place 1 spray into both nostrils daily. 04/19/20   Darr, Veryl Speak, PA-C  Menthol (CEPACOL SORE THROAT) 5.4 MG LOZG Use as directed 1 lozenge (5.4 mg total) in the mouth or throat every 2 (two) hours as needed. 04/19/20   Darr, Veryl Speak, PA-C  Multiple Vitamin (MULTIVITAMIN) tablet Take 1 tablet by mouth daily.    [provider]  Prenatal Vit-Fe Fumarate-FA (MULTIVITAMIN-PRENATAL) 27-0.8 MG TABS tablet Take 1 tablet by mouth daily at 12 noon.    [provider]  Throat Lozenges (COUGH DROPS MT) Use as directed in the mouth or throat.    [provider]    Family History History reviewed. No pertinent family history.  Social History Social History   Tobacco Use  . Smoking status: Current Every Day Smoker    Packs/day: 0.50  . Smokeless tobacco: Never Used  Substance Use Topics  . Alcohol use: No  . Drug use: Not Currently    Types: Marijuana     Allergies   Patient has no known allergies.   Review of Systems Review of Systems   Physical Exam Triage Vital Signs ED Triage Vitals  Enc Vitals Group     BP 04/23/20 1317 128/65     Pulse Rate 04/23/20 1317 68     Resp 04/23/20 1317 16     Temp 04/23/20 1317 98.1 F (36.7 C)     Temp Source 04/23/20 1317 Oral     SpO2 04/23/20 1317 100 %     Weight --      Height --      Head Circumference --      Peak Flow --      Pain Score 04/23/20 1318 0     Pain Loc --      Pain Edu? --      Excl. in GC? --    No  data found.  Updated Vital Signs BP 128/65 (BP Location: Right Arm)   Pulse 68   Temp 98.1 F (36.7 C) (Oral)   Resp 16   SpO2 100%   Visual Acuity Right Eye Distance:   Left Eye Distance:   Bilateral Distance:    Right Eye Near:   Left Eye Near:    Bilateral Near:     Physical Exam Constitutional:      General: She is not in acute distress.    Appearance: She is well-developed.  Cardiovascular:     Rate and Rhythm: Normal rate.  Pulmonary:     Effort: Pulmonary effort is normal.  Abdominal:     Palpations: Abdomen is not rigid.     Tenderness: There is no abdominal tenderness. There is no guarding or rebound.  Genitourinary:    Comments: Denies sores, lesions, vaginal bleeding; no pelvic pain; gu exam deferred at this time, vaginal self swab collected.   Skin:    General: Skin is warm and dry.  Neurological:     Mental Status: She is alert and oriented to person, place, and time.      UC Treatments / Results  Labs (all  labs ordered are listed, but only abnormal results are displayed) Labs Reviewed  CERVICOVAGINAL ANCILLARY ONLY    EKG   Radiology No results found.  Procedures Procedures (including critical care time)  Medications Ordered in UC Medications - No data to display  Initial Impression / Assessment and Plan / UC Course  I have reviewed the triage vital signs and the nursing notes.  Pertinent labs & imaging results that were available during my care of the patient were reviewed by me and considered in my medical decision making (see chart for details).     Diflucan provided pending vaginal cytology. Encouraged follow up with pcp for diabetes management as elevated blood sugars may be contributing to her yeast infections. Return precautions provided. Patient verbalized understanding and agreeable to plan.   Final Clinical Impressions(s) / UC Diagnoses   Final diagnoses:  Acute vaginitis     Discharge Instructions     We will start yeast treatment with your vaginal testing pending.  We will notify of you any positive findings or if any changes to treatment are needed. If normal or otherwise without concern to your results, we will not call you. Please log on to your MyChart to review your results if interested in so.   Please follow up with your primary care provider as elevated blood sugars can contribute to yeast infections.  May use over the counter  monistat to help with itching, cool compresses.    ED Prescriptions    Medication Sig Dispense Auth. Provider   fluconazole (DIFLUCAN) 150 MG tablet Take 1 tablet today. If still with symptoms may repeat in 3 days. 2 tablet Georgetta Haber, NP     PDMP not reviewed this encounter.   Georgetta Haber, NP 04/24/20 1017

## 2020-04-25 LAB — CERVICOVAGINAL ANCILLARY ONLY
Bacterial Vaginitis (gardnerella): NEGATIVE
Candida Glabrata: NEGATIVE
Candida Vaginitis: POSITIVE — AB
Chlamydia: NEGATIVE
Comment: NEGATIVE
Comment: NEGATIVE
Comment: NEGATIVE
Comment: NEGATIVE
Comment: NEGATIVE
Comment: NORMAL
Neisseria Gonorrhea: NEGATIVE
Trichomonas: NEGATIVE

## 2020-05-03 ENCOUNTER — Ambulatory Visit (INDEPENDENT_AMBULATORY_CARE_PROVIDER_SITE_OTHER): Payer: BC Managed Care – PPO

## 2020-05-03 ENCOUNTER — Other Ambulatory Visit: Payer: Self-pay

## 2020-05-03 ENCOUNTER — Ambulatory Visit
Admission: RE | Admit: 2020-05-03 | Discharge: 2020-05-03 | Disposition: A | Discharge status: Home / Self Care | Payer: BC Managed Care – PPO | Source: Ambulatory Visit | Attending: Physician Assistant | Admitting: Physician Assistant

## 2020-05-03 VITALS — BP 120/86 | HR 66 | Temp 97.7°F | Resp 16

## 2020-05-03 DIAGNOSIS — U071 COVID-19: Secondary | ICD-10-CM

## 2020-05-03 DIAGNOSIS — R0602 Shortness of breath: Secondary | ICD-10-CM | POA: Diagnosis not present

## 2020-05-03 DIAGNOSIS — R911 Solitary pulmonary nodule: Secondary | ICD-10-CM

## 2020-05-03 MED ORDER — ALBUTEROL SULFATE HFA 108 (90 BASE) MCG/ACT IN AERS: 1.0000 | INHALATION_SPRAY | Freq: Four times a day (QID) | RESPIRATORY_TRACT | 0 refills | Status: AC | PRN

## 2020-05-03 MED ORDER — BENZONATATE 200 MG PO CAPS: 200.0000 mg | ORAL_CAPSULE | Freq: Three times a day (TID) | ORAL | 0 refills | Status: AC

## 2020-05-03 NOTE — Discharge Instructions (Signed)
Chest xray shows a nodule to the left lower lobe. Please follow up with your PCP for further evaluation of this. Less likely due to COVID.   Tessalon for cough. Albuterol for shortness of breath. Follow up with PCP for further evaluation. If sudden worsening symptoms, go to the ED for further evaluation.

## 2020-05-03 NOTE — ED Provider Notes (Signed)
[facility]    CSN: [id number] Arrival date & time: 05/03/20  1445      History   Chief Complaint Chief Complaint  Patient presents with  . Nasal Congestion    HPI April Adams is a 34 y.o. female.   34 year old female comes in for continued symptoms after testing positive for COVID 04/19/2020. Symptoms started 04/19/2020 with sore throat, then developed nasal congestion, post nasal drip, sore throat, shortness of breath, cough, loss of taste/smell. Shortness of breath mostly first thing in the morning, will cough and improves the shortness of breath. Subjective fever with chills. Current every day smoker, has not smoked during this time.     Past Medical History:  Diagnosis Date  . Polycystic ovaries     There are no problems to display for this patient.   Past Surgical History:  Procedure Laterality Date  . LEEP      OB History   No obstetric history on file.      Home Medications    Prior to Admission medications   Medication Sig Start Date End Date Taking? Authorizing Provider  albuterol (VENTOLIN HFA) 108 (90 Base) MCG/ACT inhaler Inhale 1-2 puffs into the lungs every 6 (six) hours as needed for wheezing or shortness of breath. 05/03/20   Cathie Hoops, Pauletta Pickney V, PA-C  benzonatate (TESSALON) 200 MG capsule Take 1 capsule (200 mg total) by mouth every 8 (eight) hours. 05/03/20   Cathie Hoops, Danis Pembleton V, PA-C  fluticasone (FLONASE) 50 MCG/ACT nasal spray Place 1 spray into both nostrils daily. 04/19/20   Darr, Veryl Speak, PA-C  Menthol (CEPACOL SORE THROAT) 5.4 MG LOZG Use as directed 1 lozenge (5.4 mg total) in the mouth or throat every 2 (two) hours as needed. 04/19/20   Darr, Veryl Speak, PA-C  Multiple Vitamin (MULTIVITAMIN) tablet Take 1 tablet by mouth daily.    [provider]  Prenatal Vit-Fe Fumarate-FA (MULTIVITAMIN-PRENATAL) 27-0.8 MG TABS tablet Take 1 tablet by mouth daily at 12 noon.    [provider]  Throat Lozenges (COUGH DROPS MT) Use as directed in  the mouth or throat.    [provider]    Family History History reviewed. No pertinent family history.  Social History Social History   Tobacco Use  . Smoking status: Current Every Day Smoker    Packs/day: 0.50  . Smokeless tobacco: Never Used  Substance Use Topics  . Alcohol use: No  . Drug use: Not Currently    Types: Marijuana     Allergies   Patient has no known allergies.   Review of Systems Review of Systems  Reason unable to perform ROS: See HPI as above.     Physical Exam Triage Vital Signs ED Triage Vitals  Enc Vitals Group     BP 05/03/20 1458 120/86     Pulse Rate 05/03/20 1458 66     Resp 05/03/20 1458 16     Temp 05/03/20 1458 97.7 F (36.5 C)     Temp Source 05/03/20 1458 Oral     SpO2 05/03/20 1458 98 %     Weight --      Height --      Head Circumference --      Peak Flow --      Pain Score 05/03/20 1503 0     Pain Loc --      Pain Edu? --      Excl. in GC? --    No data found.  Updated Vital Signs BP 120/86 (BP Location: Left Arm)   Pulse 66   Temp 97.7 F (36.5 C) (Oral)   Resp 16   LMP 04/03/2020   SpO2 98%   Physical Exam Constitutional:      General: She is not in acute distress.    Appearance: Normal appearance. She is not ill-appearing, toxic-appearing or diaphoretic.  HENT:     Head: Normocephalic and atraumatic.     Nose:     Right Sinus: No maxillary sinus tenderness or frontal sinus tenderness.     Left Sinus: No maxillary sinus tenderness or frontal sinus tenderness.  Cardiovascular:     Rate and Rhythm: Normal rate and regular rhythm.     Heart sounds: Normal heart sounds. No murmur heard.  No friction rub. No gallop.   Pulmonary:     Effort: Pulmonary effort is normal. No accessory muscle usage, prolonged expiration, respiratory distress or retractions.     Comments: Lungs clear to auscultation without adventitious lung sounds. Musculoskeletal:     Cervical back: Normal range of motion and neck  supple.  Neurological:     General: No focal deficit present.     Mental Status: She is alert and oriented to person, place, and time.      UC Treatments / Results  Labs (all labs ordered are listed, but only abnormal results are displayed) Labs Reviewed - No data to display  EKG   Radiology DG Chest 2 View  Result Date: 05/03/2020 CLINICAL DATA:  Shortness of breath.  Recent COVID-19 positive EXAM: CHEST - 2 VIEW COMPARISON:  July 19, 2011 FINDINGS: There is a 7 mm nodular opacity either in or overlying the anterior left seventh rib. Lungs elsewhere clear. The heart size and pulmonary vascularity are normal. No adenopathy. No bone lesions. IMPRESSION: 7 mm nodular opacity either in or overlying the anterior left seventh rib. This finding warrants consideration for apical lordotic chest radiograph or chest CT to assess whether this nodular opacity resides within lung or rib. Lungs otherwise clear. Cardiac silhouette normal. These results will be called to the ordering clinician or representative by the Radiologist Assistant, and communication documented in the PACS or [employer]. Electronically Signed   By: Bretta Bang III M.D.   On: 05/03/2020 15:49    Procedures Procedures (including critical care time)  Medications Ordered in UC Medications - No data to display  Initial Impression / Assessment and Plan / UC Course  I have reviewed the triage vital signs and the nursing notes.  Pertinent labs & imaging results that were available during my care of the patient were reviewed by me and considered in my medical decision making (see chart for details).    COVID positive 04/19/2020, with continued symptoms. Now with shob, mostly mornings that improve throughout the day. Patient with stable vitals. LCTAB. However, given COVID positive experiencing shob, will obtain CXR for further evaluation.  Discussed chest x-ray results with Dr Leonides Grills, lung nodule seen on chest x-ray  less likely related to current Covid symptoms.  This will need follow-up with PCP for further evaluation.  Clinically patient with improvement of Covid, will continue monitoring with symptomatic management.  Return precautions given.  Otherwise patient to follow-up with PCP in 1 to 2 weeks for further evaluation for chest x-ray results.  Patient expresses understanding and agrees to it.  Case discussed with Dr Leonides Grills, who agrees to plan.  Final Clinical Impressions(s) / UC Diagnoses   Final diagnoses:  COVID-19  Lung nodule    ED Prescriptions    Medication Sig Dispense Auth. Provider   benzonatate (TESSALON) 200 MG capsule Take 1 capsule (200 mg total) by mouth every 8 (eight) hours. 21 capsule Tyreshia Ingman V, PA-C   albuterol (VENTOLIN HFA) 108 (90 Base) MCG/ACT inhaler Inhale 1-2 puffs into the lungs every 6 (six) hours as needed for wheezing or shortness of breath. 8 g Belinda Fisher, PA-C     PDMP not reviewed this encounter.   Belinda Fisher, PA-C 05/03/20 1643

## 2020-05-03 NOTE — ED Triage Notes (Signed)
Pt states was covid positive on 07/20. States has still having cough, nasal congestion, post nasal drip, sore throat, and SOB. States still has no taste or smell. Pt states was supposed to have returned to work on Sunday night but was unable to.

## 2020-05-04 DIAGNOSIS — U071 COVID-19: Secondary | ICD-10-CM | POA: Diagnosis not present

## 2020-05-04 DIAGNOSIS — R05 Cough: Secondary | ICD-10-CM | POA: Diagnosis not present

## 2020-05-04 DIAGNOSIS — R0600 Dyspnea, unspecified: Secondary | ICD-10-CM | POA: Diagnosis not present

## 2020-05-04 DIAGNOSIS — R0981 Nasal congestion: Secondary | ICD-10-CM | POA: Diagnosis not present

## 2020-08-14 IMAGING — DX DG CHEST 2V
2 series · 2 of 2 positions shown · non-contrast
Comparison: July 19, 2011

CLINICAL DATA: Shortness of breath.  Recent [id number] positive

EXAM:
CHEST - 2 VIEW

[chest pa]
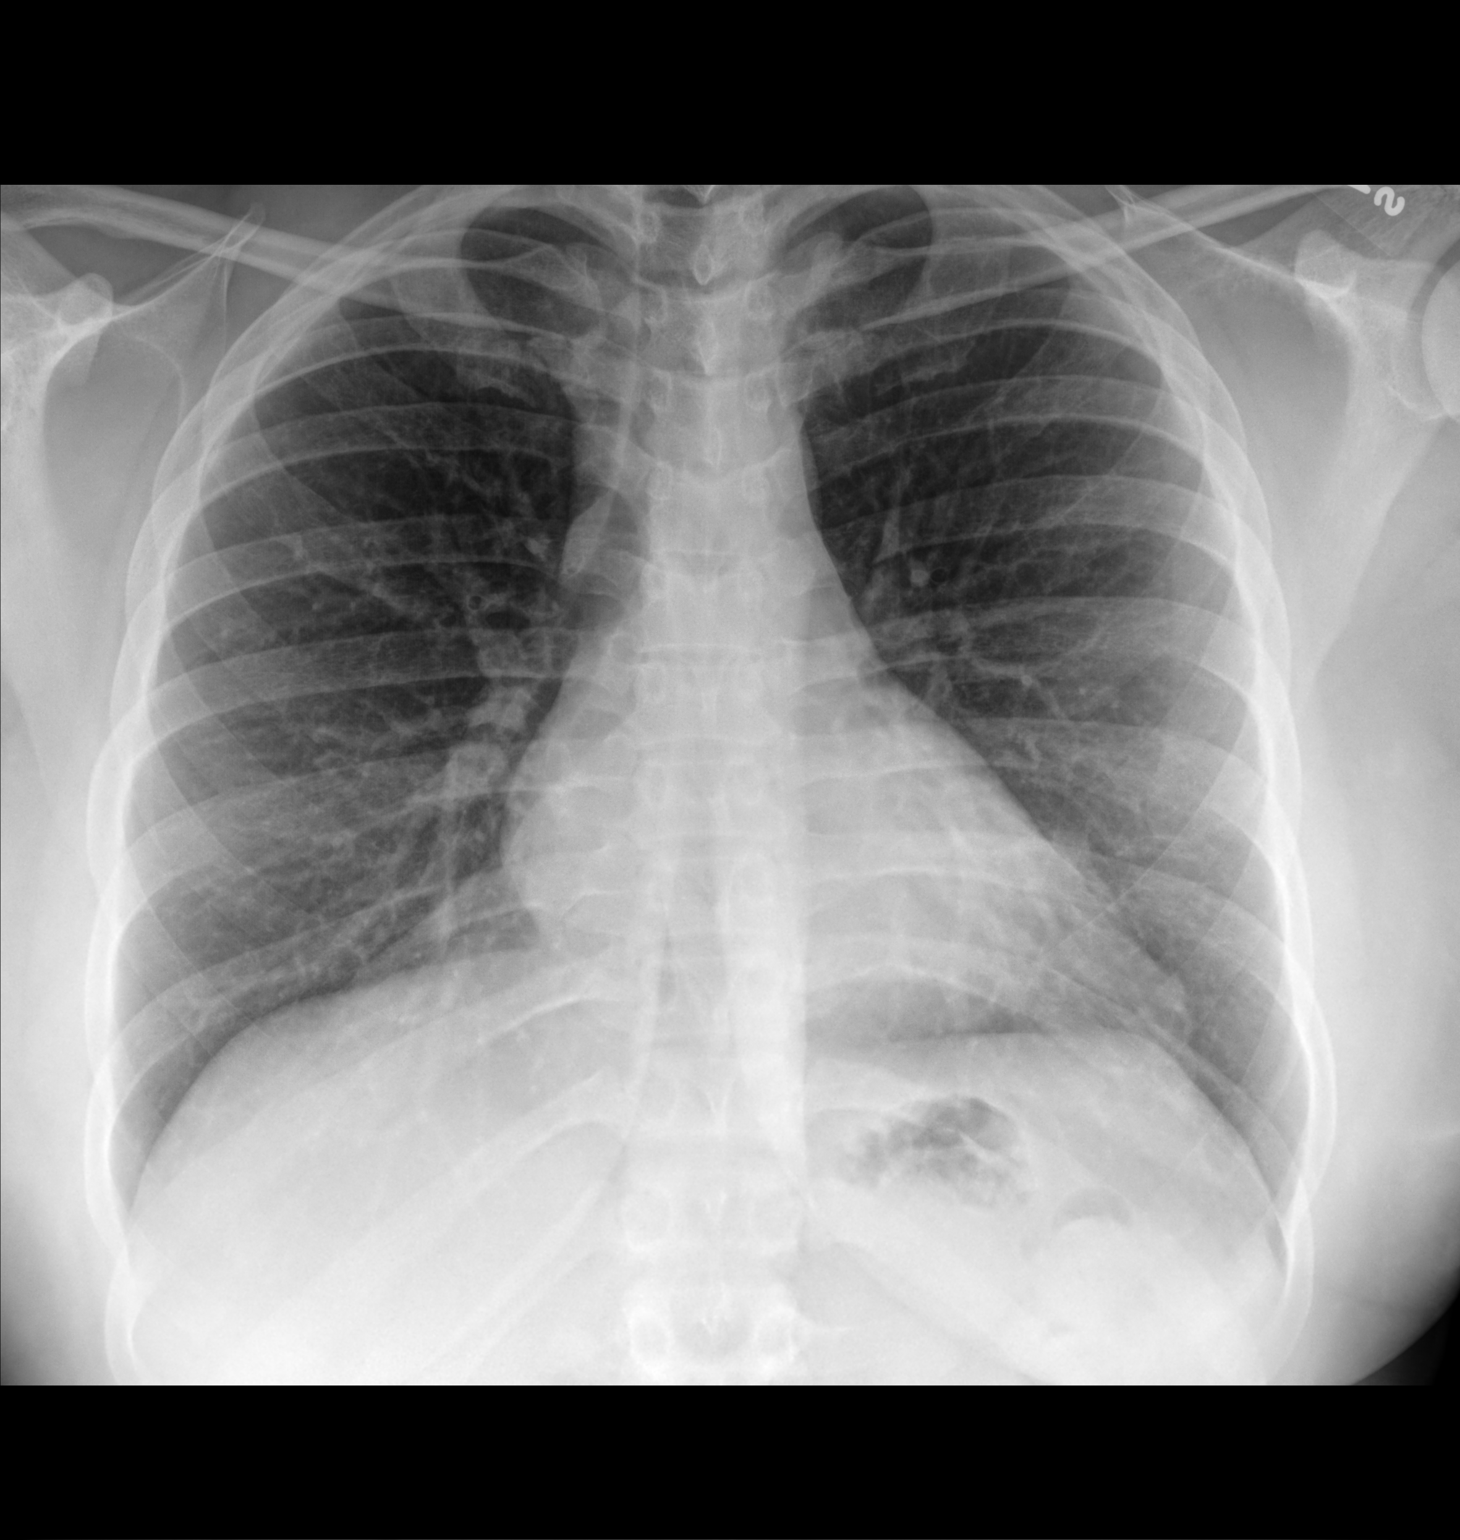

[chest lat]
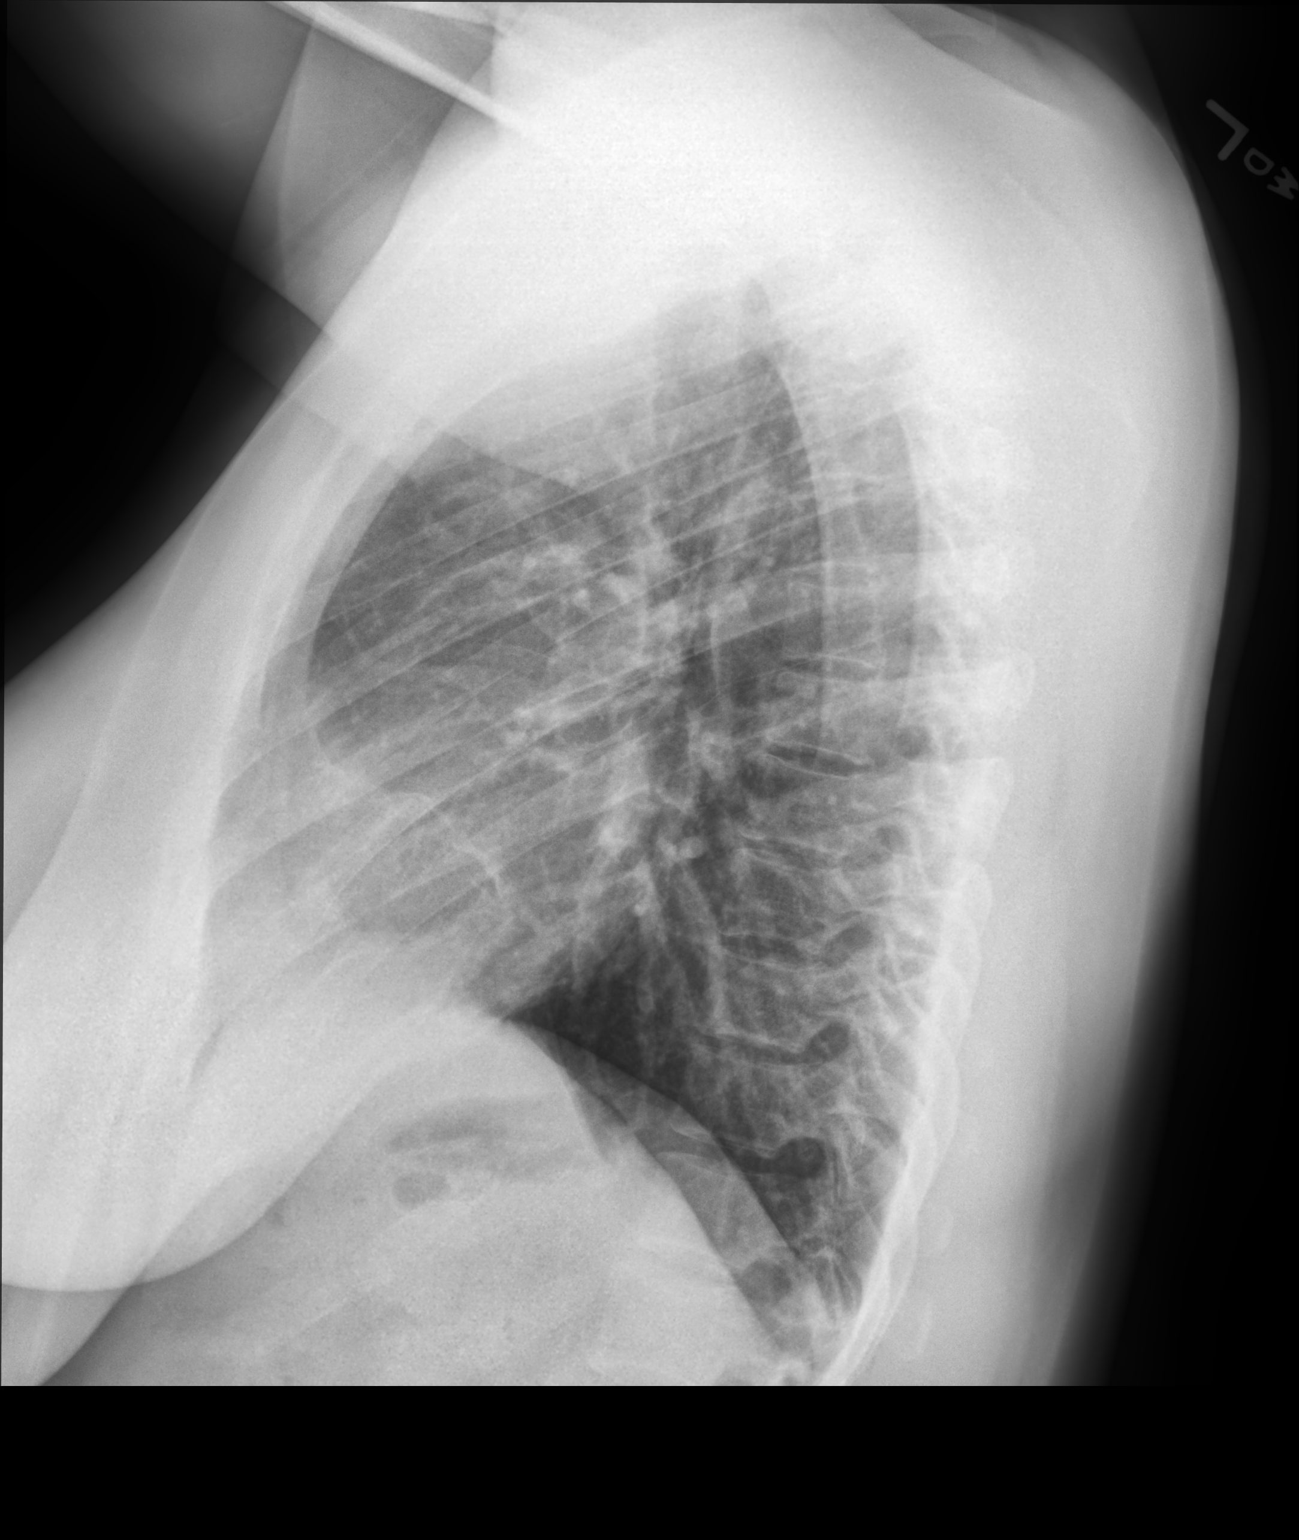

[2 of 2 positions shown; findings below may reference images not displayed]

FINDINGS: There is a 7 mm nodular opacity either in or overlying the anterior
left seventh rib. Lungs elsewhere clear. The heart size and
pulmonary vascularity are normal. No adenopathy. No bone lesions.
IMPRESSION: 7 mm nodular opacity either in or overlying the anterior left
seventh rib. This finding warrants consideration for apical lordotic
chest radiograph or chest CT to assess whether this nodular opacity
resides within lung or rib. Lungs otherwise clear. Cardiac
silhouette normal.

These results will be called to the ordering clinician or
representative by the Radiologist Assistant, and communication
documented in the PACS or [REDACTED].

## 2020-09-13 DIAGNOSIS — J069 Acute upper respiratory infection, unspecified: Secondary | ICD-10-CM | POA: Diagnosis not present

## 2020-11-28 ENCOUNTER — Other Ambulatory Visit: Payer: Self-pay | Admitting: Nurse Practitioner

## 2020-11-28 DIAGNOSIS — R0600 Dyspnea, unspecified: Secondary | ICD-10-CM

## 2023-04-01 ENCOUNTER — Encounter (INDEPENDENT_AMBULATORY_CARE_PROVIDER_SITE_OTHER): Payer: Self-pay | Admitting: Family Medicine
